# Patient Record
Sex: Male | Born: 1985 | Race: Black or African American | Hispanic: No | Marital: Single | Smoking: Current every day smoker
Health system: Southern US, Community
[De-identification: ages and names within clinical notes are randomized; demographics above are authoritative.]

## PROBLEM LIST (undated history)

## (undated) ENCOUNTER — Emergency Department (HOSPITAL_COMMUNITY): Disposition: A | Discharge status: Home / Self Care | Payer: Self-pay

## (undated) DIAGNOSIS — M5431 Sciatica, right side: Principal | ICD-10-CM

## (undated) DIAGNOSIS — I10 Essential (primary) hypertension: Secondary | ICD-10-CM

## (undated) HISTORY — DX: Sciatica, right side: M54.31

---
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
## 2009-04-19 ENCOUNTER — Emergency Department (HOSPITAL_COMMUNITY): Admission: EM | Admit: 2009-04-19 | Discharge: 2009-04-19 | Payer: Self-pay | Admitting: Emergency Medicine

## 2011-05-12 ENCOUNTER — Encounter (HOSPITAL_COMMUNITY): Payer: Self-pay | Admitting: *Deleted

## 2011-05-12 ENCOUNTER — Emergency Department (INDEPENDENT_AMBULATORY_CARE_PROVIDER_SITE_OTHER): Payer: Self-pay

## 2011-05-12 ENCOUNTER — Emergency Department (INDEPENDENT_AMBULATORY_CARE_PROVIDER_SITE_OTHER)
Admission: EM | Admit: 2011-05-12 | Discharge: 2011-05-12 | Disposition: A | Discharge status: Home / Self Care | Payer: Self-pay | Source: Home / Self Care | Attending: Family Medicine | Admitting: Family Medicine

## 2011-05-12 DIAGNOSIS — M25519 Pain in unspecified shoulder: Secondary | ICD-10-CM

## 2011-05-12 HISTORY — DX: Essential (primary) hypertension: I10

## 2011-05-12 MED ORDER — HYDROCODONE-ACETAMINOPHEN 5-325 MG PO TABS: ORAL_TABLET | ORAL | Status: AC

## 2011-05-12 MED ORDER — NAPROXEN 500 MG PO TABS: 500.0000 mg | ORAL_TABLET | Freq: Two times a day (BID) | ORAL | Status: AC
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
## 2011-05-12 NOTE — Discharge Instructions (Signed)
Your xray was negative for any fracture or acute findings. Take medications as directed. Use mild heat (heating pad, warm baths, etc) for 10 to 15 minutes, two to three times daily, as needed and as tolerated, taking care to not burn the skin. Begin stretches and exercises, as discussed, after 48 hours. If your symptoms do not improve over the next 2 weeks, please call the Orthopaedic provider listed on your discharge papers and make an appointment for evaluation. Return to care should your symptoms not improve, or worsen in any way, such as numbness, weakness, or tingling.

## 2012-04-20 ENCOUNTER — Encounter (HOSPITAL_COMMUNITY): Payer: Self-pay | Admitting: *Deleted

## 2012-04-20 ENCOUNTER — Emergency Department (INDEPENDENT_AMBULATORY_CARE_PROVIDER_SITE_OTHER)
Admission: EM | Admit: 2012-04-20 | Discharge: 2012-04-20 | Disposition: A | Discharge status: Home / Self Care | Payer: Self-pay | Source: Home / Self Care | Attending: Family Medicine | Admitting: Family Medicine

## 2012-04-20 DIAGNOSIS — R040 Epistaxis: Secondary | ICD-10-CM

## 2012-04-20 DIAGNOSIS — I10 Essential (primary) hypertension: Secondary | ICD-10-CM

## 2012-04-20 LAB — POCT I-STAT, CHEM 8
BUN: 13 mg/dL (ref 6–23)
Calcium, Ion: 1.18 mmol/L (ref 1.12–1.23)
HCT: 57 % — ABNORMAL HIGH (ref 39.0–52.0)
Hemoglobin: 19.4 g/dL — ABNORMAL HIGH (ref 13.0–17.0)
Sodium: 139 mEq/L (ref 135–145)
TCO2: 33 mmol/L (ref 0–100)

## 2012-04-20 MED ORDER — AMLODIPINE BESYLATE 10 MG PO TABS: 10.0000 mg | ORAL_TABLET | Freq: Every day | ORAL | Status: DC

## 2012-04-20 MED ORDER — HYDROCHLOROTHIAZIDE 25 MG PO TABS: 25.0000 mg | ORAL_TABLET | Freq: Every day | ORAL | Status: DC

## 2012-04-20 NOTE — ED Notes (Signed)
RN Alinda Money informed of Pt's High BP

## 2012-04-20 NOTE — ED Notes (Signed)
Pt  Has  High blood pressure    He  Reports  He  Is  Taking  His  meds            The  Nosebleed  Has  Subsided            He is  Awake and  Alert  denys  Any  Chest  Pain or any  Shortness  Of  Breath

## 2012-04-20 NOTE — ED Notes (Signed)
Vitals obtained by xray students 

## 2012-04-20 NOTE — ED Provider Notes (Signed)
History     CSN: [id number]  Arrival date & time 04/20/12  1436   First MD Initiated Contact with Patient 04/20/12 1452      Chief Complaint  Patient presents with  . Epistaxis    (Consider location/radiation/quality/duration/timing/severity/associated sxs/prior treatment) Patient is a 27 y.o. male presenting with nosebleeds. The history is provided by the patient and a parent.  Epistaxis  This is a new problem. The current episode started less than 1 hour ago. The problem has been gradually improving. The bleeding has been from the left nare. His past medical history is significant for colds and allergies. His past medical history does not include frequent nosebleeds.    Past Medical History  Diagnosis Date  . Hypertension     History reviewed. No pertinent past surgical history.  Family History  Problem Relation Age of Onset  . Diabetes Mother   . Hypertension Maternal Aunt   . Hypertension Paternal Aunt     History  Substance Use Topics  . Smoking status: Current Every Day Smoker -- 0.50 packs/day for 7 years    Types: Cigarettes  . Smokeless tobacco: Not on file  . Alcohol Use: Yes     Comment: occasional      Review of Systems  Constitutional: Negative.   HENT: Positive for nosebleeds, congestion and rhinorrhea.   Respiratory: Negative.   Cardiovascular: Negative.   Gastrointestinal: Negative.   Neurological: Negative.     Allergies  Review of patient's allergies indicates no known allergies.  Home Medications   Current Outpatient Rx  Name  Route  Sig  Dispense  Refill  . amLODipine (NORVASC) 10 MG tablet   Oral   Take 10 mg by mouth daily.         . hydrochlorothiazide (HYDRODIURIL) 25 MG tablet   Oral   Take 25 mg by mouth daily.         . meloxicam (MOBIC) 15 MG tablet   Oral   Take 15 mg by mouth daily.         . traMADol (ULTRAM) 50 MG tablet   Oral   Take 50 mg by mouth every 6 (six) hours as needed for pain.         Marland Kitchen  amLODipine (NORVASC) 10 MG tablet   Oral   Take 1 tablet (10 mg total) by mouth daily.   30 tablet   0   . hydrochlorothiazide (HYDRODIURIL) 25 MG tablet   Oral   Take 1 tablet (25 mg total) by mouth daily.   30 tablet   0   . naproxen (NAPROSYN) 500 MG tablet   Oral   Take 1 tablet (500 mg total) by mouth 2 (two) times daily with a meal.   30 tablet   0     BP 154/109  Pulse 113  Temp(Src) 98.1 F (36.7 C) (Oral)  Resp 18  SpO2 100%  Physical Exam  Nursing note and vitals reviewed. Constitutional: He is oriented to person, place, and time. He appears well-developed and well-nourished.  HENT:  Head: Normocephalic.  Right Ear: External ear normal.  Left Ear: External ear normal.  Nose: No epistaxis.    Mouth/Throat: Oropharynx is clear and moist.  Eyes: Conjunctivae are normal. Pupils are equal, round, and reactive to light.  Neck: Normal range of motion. Neck supple.  Cardiovascular: Regular rhythm.   Pulmonary/Chest: Breath sounds normal.  Lymphadenopathy:    He has no cervical adenopathy.  Neurological: He is alert and oriented  to person, place, and time.  Skin: Skin is warm and dry.    ED Course  Procedures (including critical care time)  Labs Reviewed  POCT I-STAT, CHEM 8 - Abnormal; Notable for the following:    Potassium 3.3 (*)    Glucose, Bld 102 (*)    Hemoglobin 19.4 (*)    HCT 57.0 (*)    All other components within normal limits   No results found.   1. Anterior epistaxis   2. Hypertension       MDM  Epistaxis resolved at d/c.       Linna Hoff, MD 04/20/12 [phone]

## 2012-04-26 ENCOUNTER — Other Ambulatory Visit (HOSPITAL_COMMUNITY): Payer: Self-pay | Admitting: Internal Medicine

## 2012-04-26 ENCOUNTER — Ambulatory Visit (HOSPITAL_COMMUNITY)
Admission: RE | Admit: 2012-04-26 | Discharge: 2012-04-26 | Disposition: A | Discharge status: Home / Self Care | Payer: Self-pay | Source: Ambulatory Visit | Attending: Internal Medicine | Admitting: Internal Medicine

## 2012-04-26 DIAGNOSIS — M545 Low back pain, unspecified: Secondary | ICD-10-CM | POA: Insufficient documentation

## 2012-04-26 DIAGNOSIS — M79609 Pain in unspecified limb: Secondary | ICD-10-CM | POA: Insufficient documentation

## 2012-06-22 ENCOUNTER — Ambulatory Visit (INDEPENDENT_AMBULATORY_CARE_PROVIDER_SITE_OTHER): Payer: Self-pay | Admitting: Neurology

## 2012-06-22 ENCOUNTER — Encounter: Payer: Self-pay | Admitting: Neurology

## 2012-06-22 VITALS — BP 169/108 | HR 74 | Ht 74.25 in | Wt 270.0 lb

## 2012-06-22 DIAGNOSIS — M5430 Sciatica, unspecified side: Secondary | ICD-10-CM

## 2012-06-22 DIAGNOSIS — M5431 Sciatica, right side: Secondary | ICD-10-CM

## 2012-06-22 HISTORY — DX: Sciatica, right side: M54.31

## 2012-06-22 NOTE — Progress Notes (Signed)
Reason for visit: Right leg pain  Kristopher Anderson is a 27 y.o. male  History of present illness:  Kristopher Anderson is a 27 year old right-handed black male with a history of back pain and right leg discomfort that began in September of 2013. The patient indicates that the problem came on spontaneously, without injury. The patient has noted that the pain has worsened over time. The patient indicates that while sitting or lying down, he has no discomfort whatsoever. When he stands up, the pain comes on rather quickly, and worsens as long as he is up. The patient will have discomfort that mainly includes the right lower back, hip, and posterior thigh to the knee. Occasionally, he will have aching pain into the gastrocnemius muscle, and some tingly sensations down the leg into the foot. The patient is not clear that there is any actual weakness of the leg, but he feels stiff in the hamstrings when he tries to extend the right leg. The patient has not had any falls. The patient denies any problems with the left leg or problems controlling the bowels or the bladder. The patient denies any neck pain or arm discomfort. The patient has had plain x-ray evaluation of the low back that was unremarkable. The patient is on meloxicam, and Ultram for discomfort. This has not been helpful.  Past Medical History  Diagnosis Date  . Hypertension   . Sciatica of right side 06/22/2012    History reviewed. No pertinent past surgical history.  Family History  Problem Relation Age of Onset  . Diabetes Mother   . Hypertension Mother   . Hypertension Maternal Aunt   . Hypertension Paternal Aunt   . Hypertension Maternal Grandmother     Social history:  reports that he has been smoking Cigarettes.  He has a 3.5 pack-year smoking history. He does not have any smokeless tobacco history on file. He reports that  drinks alcohol. He reports that he does not use illicit drugs.  Medications:  Current Outpatient  Prescriptions on File Prior to Visit  Medication Sig Dispense Refill  . amLODipine (NORVASC) 10 MG tablet Take 10 mg by mouth daily.      . hydrochlorothiazide (HYDRODIURIL) 25 MG tablet Take 25 mg by mouth daily.      . meloxicam (MOBIC) 15 MG tablet Take 15 mg by mouth daily.      . traMADol (ULTRAM) 50 MG tablet Take 50 mg by mouth every 6 (six) hours as needed for pain.       No current facility-administered medications on file prior to visit.    Allergies: No Known Allergies  ROS:  Out of a complete 14 system review of symptoms, the patient complains only of the following symptoms, and all other reviewed systems are negative.  Joint pain, achy muscles  Blood pressure 169/108, pulse 74, height 6' 2.25" (1.886 m), weight 270 lb (122.471 kg).  Physical Exam  General: The patient is alert and cooperative at the time of the examination. The patient is minimally obese.  Head: Pupils are equal, round, and reactive to light. Discs are flat bilaterally.  Neck: The neck is supple, no carotid bruits are noted.  Respiratory: The respiratory examination is clear.  Cardiovascular: The cardiovascular examination reveals a regular rate and rhythm, no obvious murmurs or rubs are noted.  Neuromuscular: The patient is able to flex to about 100 with low back. The patient has no significant tenderness to palpation of the low back or over the right  SI joint.  Skin: Extremities are without significant edema.  Neurologic Exam  Mental status:  Cranial nerves: Facial symmetry is present. There is good sensation of the face to pinprick and soft touch bilaterally. The strength of the facial muscles and the muscles to head turning and shoulder shrug are normal bilaterally. Speech is well enunciated, no aphasia or dysarthria is noted. Extraocular movements are full. Visual fields are full.  Motor: The motor testing reveals 5 over 5 strength of all 4 extremities, with the exception that there may be  slight weakness with inversion of the right foot. The patient is able to walk on the heels and the toes bilaterally. Good symmetric motor tone is noted throughout.  Sensory: Sensory testing is intact to pinprick, soft touch, vibration sensation, and position sense on all 4 extremities. No evidence of extinction is noted.  Coordination: Cerebellar testing reveals good finger-nose-finger and heel-to-shin bilaterally.  Gait and station: Gait is normal. Tandem gait is normal. Romberg is negative. No drift is seen.  Reflexes: Deep tendon reflexes are symmetric and normal bilaterally. Toes are downgoing bilaterally.   Assessment/Plan:  One. Right sided sciatica  The clinical examination is relatively unremarkable showing no reflex asymmetry or definite weakness. The patient will be evaluated for a possible L5 or S1 radiculopathy. It is possible that the patient may have right SI joint dysfunction. The patient will be set up for MRI evaluation of the lumbosacral spine. If this is unremarkable, we may consider EMG and nerve conduction studies involving the right leg. The patient will followup if needed.  Marlan Palau MD 06/22/2012 7:54 PM  [facility] [address] [place], [place] [address]  Phone [phone] Fax [phone]

## 2012-07-02 ENCOUNTER — Ambulatory Visit
Admission: RE | Admit: 2012-07-02 | Discharge: 2012-07-02 | Disposition: A | Discharge status: Home / Self Care | Payer: Self-pay | Source: Ambulatory Visit | Attending: Neurology | Admitting: Neurology

## 2012-07-02 DIAGNOSIS — M5431 Sciatica, right side: Secondary | ICD-10-CM

## 2012-07-02 DIAGNOSIS — M549 Dorsalgia, unspecified: Secondary | ICD-10-CM

## 2012-07-04 ENCOUNTER — Telehealth: Payer: Self-pay | Admitting: Neurology

## 2012-07-04 NOTE — Telephone Encounter (Signed)
I called patient. 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 This likely will need surgery. I have left a message on his cell phone, he is to call me back.

## 2012-07-08 ENCOUNTER — Telehealth: Payer: Self-pay | Admitting: *Deleted

## 2012-07-08 NOTE — Telephone Encounter (Signed)
Error

## 2012-07-11 ENCOUNTER — Telehealth: Payer: Self-pay | Admitting: Neurology

## 2012-07-11 NOTE — Telephone Encounter (Signed)
I called the patient and I left a message. 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 If amenable for surgery, I will set up a referral.

## 2012-07-25 ENCOUNTER — Telehealth: Payer: Self-pay | Admitting: *Deleted

## 2012-07-25 DIAGNOSIS — M5431 Sciatica, right side: Secondary | ICD-10-CM

## 2012-07-25 NOTE — Telephone Encounter (Signed)
Called patient to get some more information he states, Dr. Anne Hahn said he would get him a referral when he was ready. I let the patient know Dr. Anne Hahn was on vacation and they where fine with waiting for him to return i see some notes from his assistant will consult her to see what she knows.

## 2012-07-25 NOTE — Telephone Encounter (Signed)
Message copied by Salome Spotted on Mon Jul 25, 2012 11:37 AM ------      Message from: Arther Abbott B      Created: Mon Jul 25, 2012  9:50 AM      Contact: Pt Compton       Pt would like for Dr. Anne Hahn or his nurse to call him about his surgery that Dr. Anne Hahn had left a message pertaining to pt having this surgery.   ------

## 2012-07-26 NOTE — Telephone Encounter (Signed)
I called patient at home and on the cell phone. I left messages. I will call back later.

## 2012-07-29 NOTE — Telephone Encounter (Signed)
I called patient. He is amenable to surgery on the low back. I'll get this set up.

## 2012-10-14 ENCOUNTER — Telehealth: Payer: Self-pay | Admitting: Neurology

## 2012-10-14 ENCOUNTER — Ambulatory Visit: Payer: Self-pay | Attending: Family Medicine

## 2012-11-01 ENCOUNTER — Ambulatory Visit: Payer: Self-pay

## 2014-01-26 IMAGING — CR DG LUMBAR SPINE COMPLETE 4+V
5 series · 5 of 5 positions shown · non-contrast
Comparison: None

CLINICAL DATA: Lower right side back pain extending into right leg
for 2 months, no known injury

LUMBAR SPINE - COMPLETE 4+ VIEW

[t lumbar spine ap]
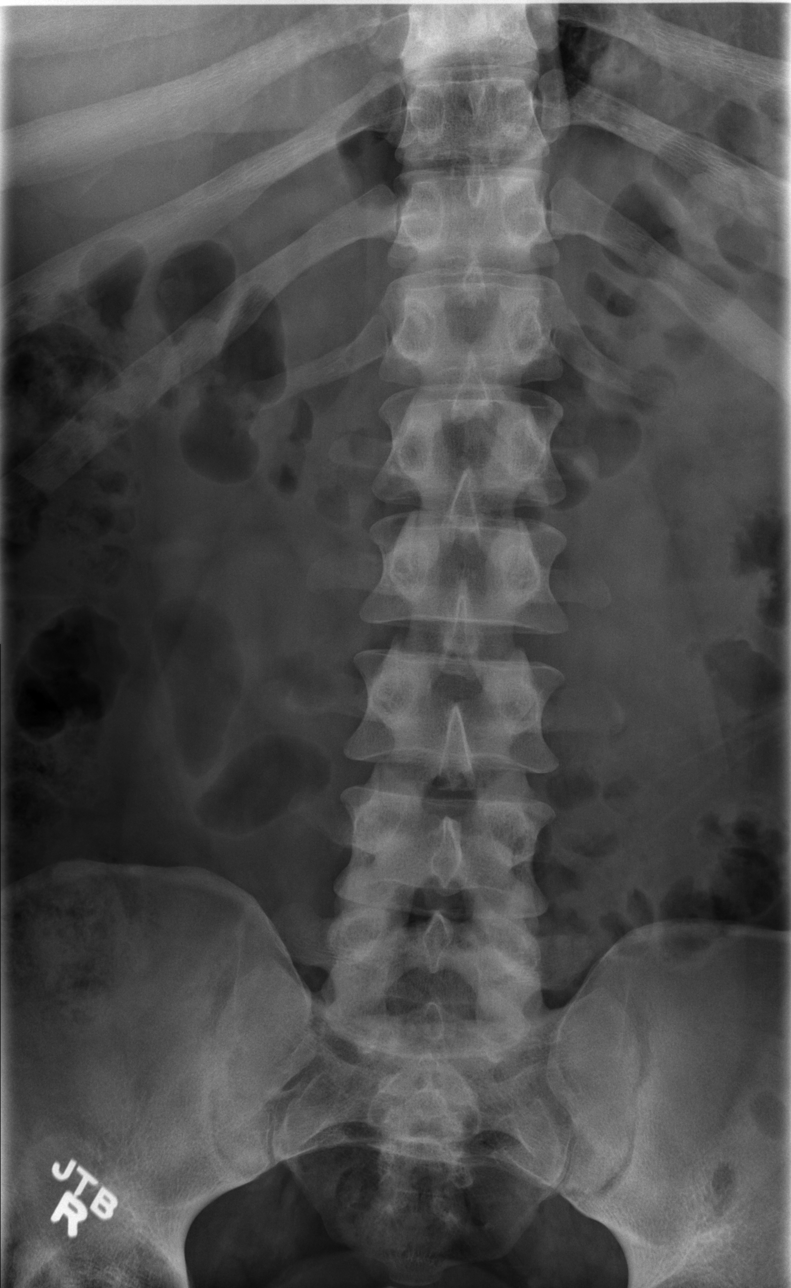

[t lumbar spine obl (1 of 2)]
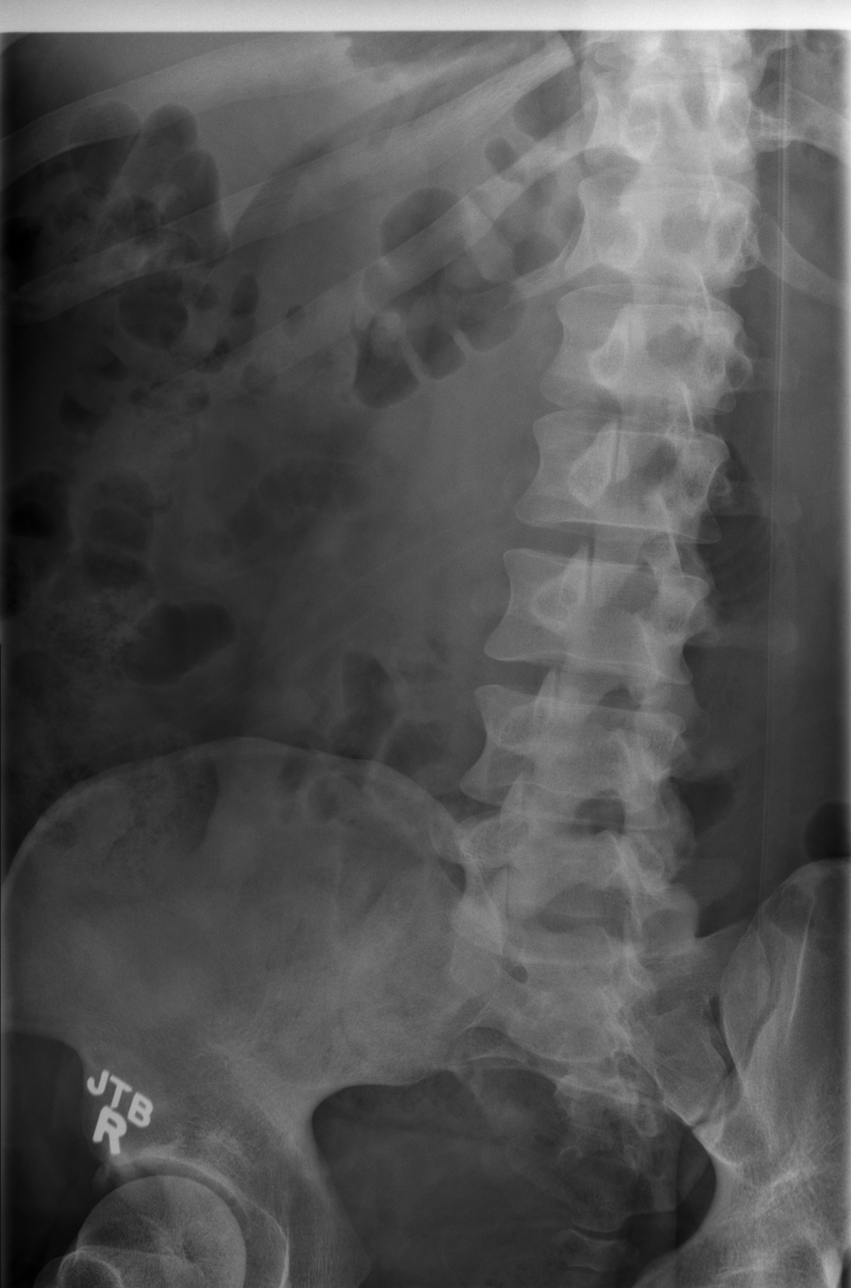

[t lumbar spine obl (2 of 2)]
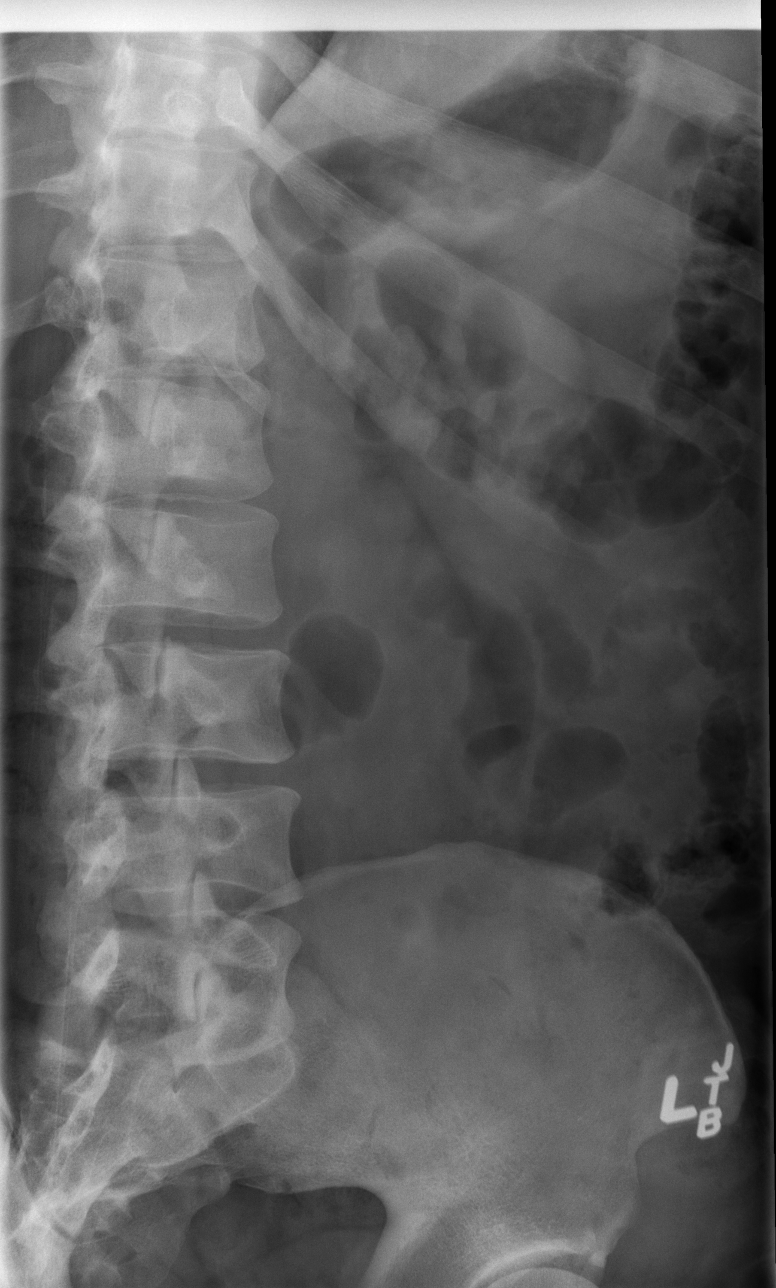

[t lumbar spine lat]
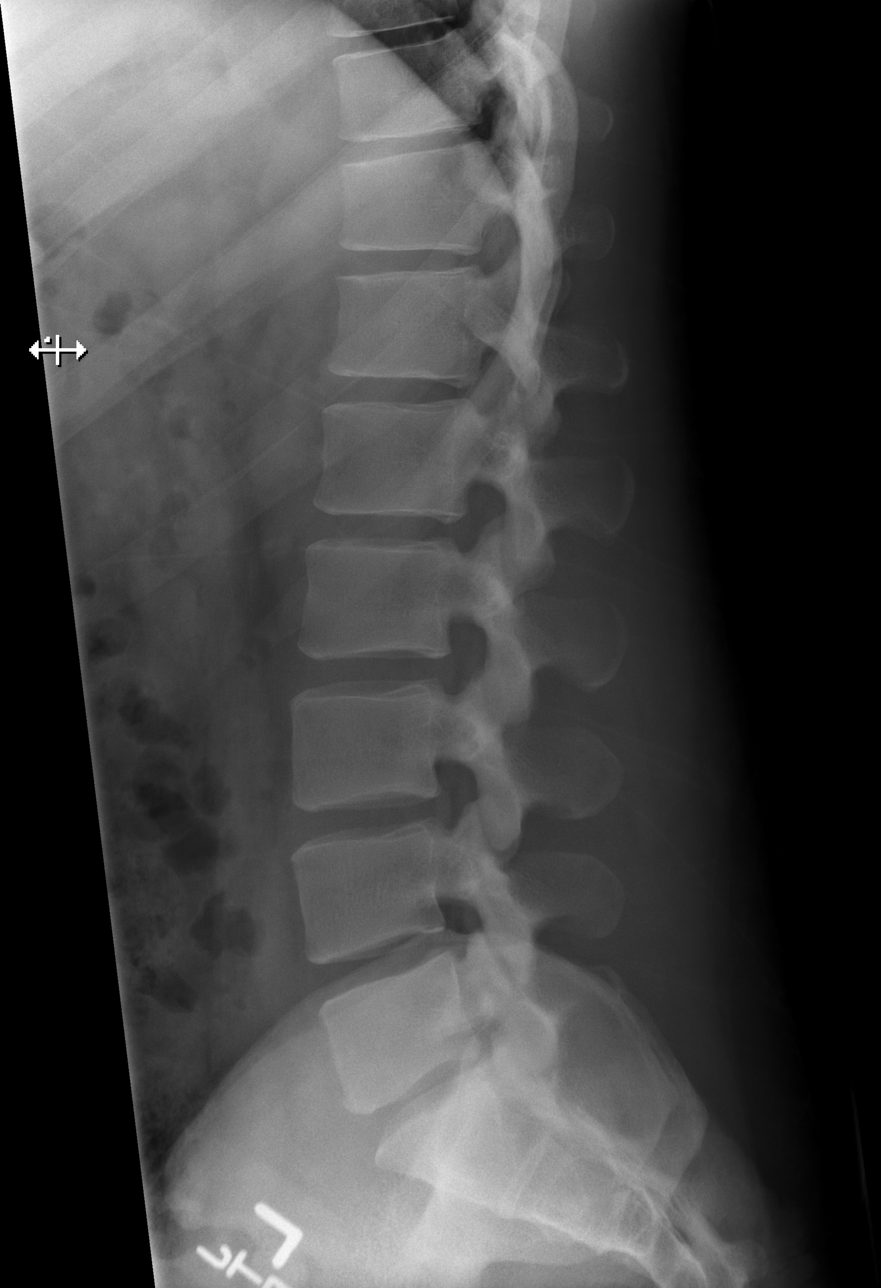

[t lumbar l-5 s-1 spot]
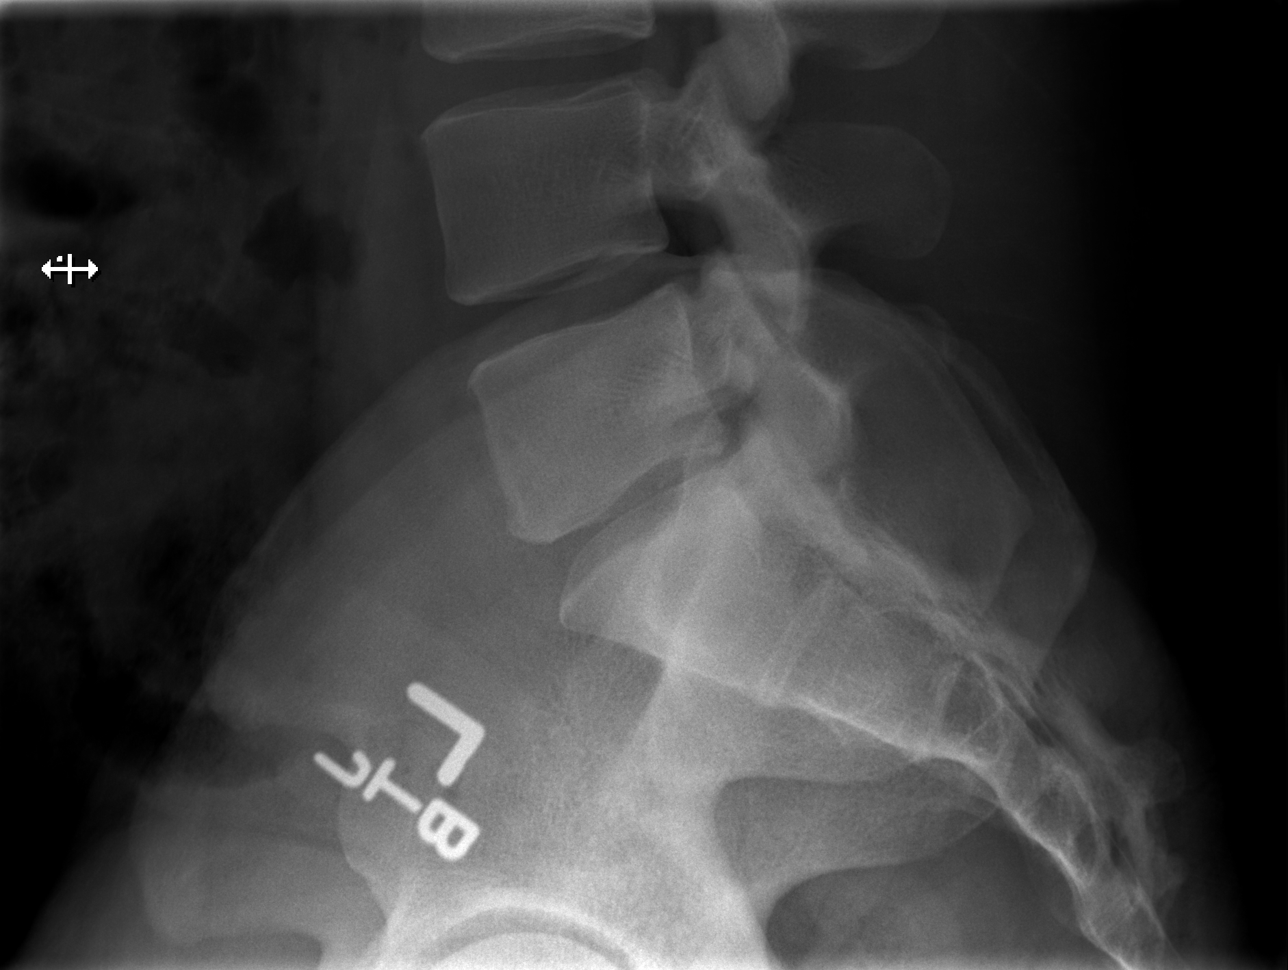

[5 of 5 positions shown; findings below may reference images not displayed]

FINDINGS: Osseous mineralization normal.
Five non-rib bearing lumbar vertebrae.
Vertebral body and disc space heights maintained.
No acute fracture, subluxation or bone destruction.
No spondylolysis.
SI joints symmetric.
IMPRESSION: No acute abnormalities.

## 2014-11-28 NOTE — Telephone Encounter (Signed)
Error
